# Patient Record
Sex: Female | Born: 1935 | Race: Black or African American | Hispanic: No | Smoking: Never smoker
Health system: Southern US, Community
[De-identification: ages and names within clinical notes are randomized; demographics above are authoritative.]

## PROBLEM LIST (undated history)

## (undated) DIAGNOSIS — I10 Essential (primary) hypertension: Secondary | ICD-10-CM

## (undated) DIAGNOSIS — R519 Headache, unspecified: Secondary | ICD-10-CM

## (undated) DIAGNOSIS — R51 Headache: Secondary | ICD-10-CM

## (undated) HISTORY — PX: APPENDECTOMY: SHX54

## (undated) HISTORY — PX: TUBAL LIGATION: SHX77

---

## 1998-09-30 ENCOUNTER — Other Ambulatory Visit: Admission: RE | Admit: 1998-09-30 | Discharge: 1998-09-30 | Payer: Self-pay | Admitting: Internal Medicine

## 1998-10-31 ENCOUNTER — Encounter: Admission: RE | Admit: 1998-10-31 | Discharge: 1998-12-16 | Payer: Self-pay | Admitting: Internal Medicine

## 2000-02-17 ENCOUNTER — Other Ambulatory Visit: Admission: RE | Admit: 2000-02-17 | Discharge: 2000-02-17 | Payer: Self-pay | Admitting: *Deleted

## 2000-02-27 ENCOUNTER — Encounter: Admission: RE | Admit: 2000-02-27 | Discharge: 2000-02-27 | Payer: Self-pay | Admitting: Internal Medicine

## 2000-02-27 ENCOUNTER — Encounter: Payer: Self-pay | Admitting: Internal Medicine

## 2001-04-29 ENCOUNTER — Encounter: Admission: RE | Admit: 2001-04-29 | Discharge: 2001-07-28 | Payer: Self-pay | Admitting: Internal Medicine

## 2001-05-05 ENCOUNTER — Encounter: Payer: Self-pay | Admitting: Family Medicine

## 2001-05-05 ENCOUNTER — Encounter: Admission: RE | Admit: 2001-05-05 | Discharge: 2001-05-05 | Payer: Self-pay | Admitting: Family Medicine

## 2004-12-01 ENCOUNTER — Other Ambulatory Visit: Admission: RE | Admit: 2004-12-01 | Discharge: 2004-12-01 | Payer: Self-pay | Admitting: Internal Medicine

## 2010-05-31 ENCOUNTER — Emergency Department (HOSPITAL_COMMUNITY)
Admission: EM | Admit: 2010-05-31 | Discharge: 2010-05-31 | Payer: Self-pay | Source: Home / Self Care | Admitting: Emergency Medicine

## 2011-08-14 ENCOUNTER — Encounter (HOSPITAL_COMMUNITY): Payer: Self-pay

## 2011-08-14 ENCOUNTER — Emergency Department (INDEPENDENT_AMBULATORY_CARE_PROVIDER_SITE_OTHER)
Admission: EM | Admit: 2011-08-14 | Discharge: 2011-08-14 | Disposition: A | Discharge status: Home / Self Care | Payer: Medicare Other | Source: Home / Self Care | Attending: Family Medicine | Admitting: Family Medicine

## 2011-08-14 DIAGNOSIS — H8309 Labyrinthitis, unspecified ear: Secondary | ICD-10-CM

## 2011-08-14 HISTORY — DX: Essential (primary) hypertension: I10

## 2011-08-14 MED ORDER — MECLIZINE HCL 25 MG PO TABS: 25.0000 mg | ORAL_TABLET | Freq: Four times a day (QID) | ORAL | Status: AC

## 2011-08-14 NOTE — ED Provider Notes (Signed)
History     CSN: [id number]  Arrival date & time 08/14/11  1619   First MD Initiated Contact with Patient 08/14/11 1715      Chief Complaint  Patient presents with  . Dizziness    (Consider location/radiation/quality/duration/timing/severity/associated sxs/prior treatment) HPI Comments: THE PATIENT REPORTS SHE WENT TO BED LAST PM FEELING FINE. WHEN SHE ROLLED OVER IN BED THIS AM THE BED BEGAN TO SPIN. SHE LAID STILL AND IT STOPPED UNTIL SHE MOVED AGAIN. SHE HAS ASSOCIATED NAUSEA. SYMPTOMS WORSE WITH CHANGE OF POSITION. HX OF SIMILAR EPISODE YRS AGO AND TOLD IT WAS INNER EAR.   The history is provided by the patient.    Past Medical History  Diagnosis Date  . Hypertension     History reviewed. No pertinent past surgical history.  History reviewed. No pertinent family history.  History  Substance Use Topics  . Smoking status: Not on file  . Smokeless tobacco: Not on file  . Alcohol Use:     OB History    Grav Para Term Preterm Abortions TAB SAB Ect Mult Living                  Review of Systems  Constitutional: Negative.   HENT: Negative.   Eyes: Negative.   Respiratory: Negative.   Cardiovascular: Negative.   Gastrointestinal: Positive for nausea. Negative for vomiting and diarrhea.  Genitourinary: Negative.   Skin: Negative.   Neurological: Positive for dizziness. Negative for headaches.    Allergies  Review of patient's allergies indicates no known allergies.  Home Medications   Current Outpatient Rx  Name Route Sig Dispense Refill  . ROSUVASTATIN CALCIUM 20 MG PO TABS Oral Take 20 mg by mouth daily.    . TELMISARTAN 80 MG PO TABS Oral Take 80 mg by mouth daily.    Marland Kitchen MECLIZINE HCL 25 MG PO TABS Oral Take 1 tablet (25 mg total) by mouth 4 (four) times daily. 28 tablet 0    BP 152/73  Pulse 76  Temp(Src) 98 F (36.7 C) (Oral)  Resp 18  SpO2 100%  Physical Exam  Constitutional: She is oriented to person, place, and time. She appears  well-developed and well-nourished.  HENT:  Head: Atraumatic.  Nose: Nose normal.  Mouth/Throat: No oropharyngeal exudate.  Eyes: Conjunctivae and EOM are normal. Pupils are equal, round, and reactive to light.  Neck: Normal range of motion. Neck supple.  Cardiovascular: Normal rate and regular rhythm.   Pulmonary/Chest: Effort normal.  Abdominal: Soft. Bowel sounds are normal.  Lymphadenopathy:    She has no cervical adenopathy.  Neurological: She is alert and oriented to person, place, and time. No cranial nerve deficit.    ED Course  Procedures (including critical care time)  Labs Reviewed - No data to display No results found.   1. Labyrinthitis       MDM          Randa Spike, MD 08/14/11 [phone]

## 2011-08-14 NOTE — ED Notes (Signed)
State she has been dizzy and nauseated since she got up this AM; went to Dr Avbreue's office this AM, but they couldn't see her since thy were busy. NAD at present; states her dizziness is worse when she changes position too quickly.  States she has not eaten today, because she is afraid to do it, denies pain

## 2011-08-14 NOTE — Discharge Instructions (Signed)
TYLENOL OR MOTRIN AS NEEDED. KEEP YOUR HEAD LEVEL.Labyrinthitis (Inner Ear Inflammation) Your exam shows you have an inner ear disturbance or labyrinthitis. The cause of this condition is not known. But it may be due to a virus infection. The symptoms of labyrinthitis include vertigo or dizziness made worse by motion, nausea and vomiting. The onset of labyrinthitis may be very sudden. It usually lasts for a few days and then clears up over 1-2 weeks. The treatment of an inner ear disturbance includes bed rest and medications to reduce dizziness, nausea, and vomiting. You should stay away from alcohol, tranquilizers, caffeine, nicotine, or any medicine your doctor thinks may make your symptoms worse. Further testing may be needed to evaluate your hearing and balance system. Please see your doctor or go to the emergency room right away if you have:  Increasing vertigo, earache, loss of hearing, or ear drainage.   Headache, blurred vision, trouble walking, fainting, or fever.   Persistent vomiting, dehydration, or extreme weakness.  Document Released: 05/04/2005 Document Revised: 04/23/2011 Document Reviewed: 10/20/2006 [facility] Patient Information 2012 [place], [place].

## 2014-01-31 ENCOUNTER — Other Ambulatory Visit: Payer: Self-pay | Admitting: Internal Medicine

## 2014-01-31 DIAGNOSIS — N63 Unspecified lump in unspecified breast: Secondary | ICD-10-CM

## 2017-02-14 ENCOUNTER — Emergency Department (HOSPITAL_COMMUNITY)
Admission: EM | Admit: 2017-02-14 | Discharge: 2017-02-14 | Disposition: A | Discharge status: Home / Self Care | Payer: Medicare Other | Attending: Emergency Medicine | Admitting: Emergency Medicine

## 2017-02-14 ENCOUNTER — Emergency Department (HOSPITAL_COMMUNITY): Payer: Medicare Other

## 2017-02-14 ENCOUNTER — Encounter (HOSPITAL_COMMUNITY): Payer: Self-pay | Admitting: *Deleted

## 2017-02-14 DIAGNOSIS — M4302 Spondylolysis, cervical region: Secondary | ICD-10-CM

## 2017-02-14 DIAGNOSIS — I10 Essential (primary) hypertension: Secondary | ICD-10-CM | POA: Diagnosis not present

## 2017-02-14 DIAGNOSIS — M542 Cervicalgia: Secondary | ICD-10-CM | POA: Diagnosis not present

## 2017-02-14 DIAGNOSIS — R51 Headache: Secondary | ICD-10-CM | POA: Diagnosis present

## 2017-02-14 HISTORY — DX: Headache, unspecified: R51.9

## 2017-02-14 HISTORY — DX: Headache: R51

## 2017-02-14 MED ORDER — ACETAMINOPHEN 500 MG PO TABS
1000.0000 mg | ORAL_TABLET | Freq: Once | ORAL | Status: AC
  Administered 2017-02-14: 1000 mg via ORAL
  Filled 2017-02-14: qty 2

## 2017-02-14 MED ORDER — DICLOFENAC SODIUM 1 % TD GEL: 2.0000 g | Freq: Four times a day (QID) | TRANSDERMAL | 0 refills | Status: AC

## 2017-02-14 MED ORDER — METHOCARBAMOL 1000 MG/10ML IJ SOLN
500.0000 mg | Freq: Once | INTRAMUSCULAR | Status: DC
  Filled 2017-02-14: qty 5

## 2017-02-14 MED ORDER — METHOCARBAMOL 1000 MG/10ML IJ SOLN: 500.0000 mg | Freq: Once | INTRAMUSCULAR | Status: DC

## 2017-02-14 MED ORDER — CARISOPRODOL 350 MG PO TABS: 350.0000 mg | ORAL_TABLET | Freq: Four times a day (QID) | ORAL | 0 refills | Status: AC | PRN

## 2017-02-14 MED ORDER — METHOCARBAMOL 500 MG PO TABS
500.0000 mg | ORAL_TABLET | ORAL | Status: AC
  Administered 2017-02-14: 500 mg via ORAL
  Filled 2017-02-14: qty 1

## 2017-02-14 MED ORDER — ACETAMINOPHEN 500 MG PO TABS: 1000.0000 mg | ORAL_TABLET | Freq: Three times a day (TID) | ORAL | 0 refills | Status: AC | PRN

## 2017-02-14 NOTE — ED Notes (Signed)
Bed: [facility] Expected date: 02/14/17 Expected time: 1:48 PM Means of arrival: Ambulance Comments: Headache, chronic  2 weeks

## 2017-02-14 NOTE — ED Notes (Signed)
Patient transported to CT 

## 2017-02-14 NOTE — Discharge Instructions (Signed)
Your CT scan showed spondylolysis, which is a forward displacement of vertebra usually due to trauma, repetitive motion or aging.  This is likely what is causing your pain.   Please take 1000 mg tylenol every 8 hours. Additionally, massage voltaren gel over area of pain. Robaxin will help with associated muscle spasms and tightness. Place a heating pad over the area.   Follow up with neurosurgery for further management of your pain.   Return to ED for worsening, uncontrollable pain, numbness or weakness to extremities, fevers, chills

## 2017-02-14 NOTE — ED Provider Notes (Signed)
[facility]-EMERGENCY DEPT Provider Note   CSN: [id number] Arrival date & time: 02/14/17  1354     History   Chief Complaint Chief Complaint  Patient presents with  . Headache    HPI Marilyn Davidson is a 83 y.o. female with a history of well-controlled hypertension presents to the ED for evaluation of right-sided neck pain, constant, nonradiating 2 weeks file worsening over the last day. Has been taking extra strength Excedrin twice a day without relief. Denies recent trauma, MVC, changes in sleeping positions. Daughter at bedside states patient often dozes off at the kitchen table with her head bobbing up and down. They just got her a neck pillow which patient did not like and has not used. Patient denies headache, visual changes, fevers, nausea, vomiting, chest pain, shortness of breath, numbness or weakness to extremities.  HPI  Past Medical History:  Diagnosis Date  . Headache   . Hypertension     There are no active problems to display for this patient.   Past Surgical History:  Procedure Laterality Date  . APPENDECTOMY    . TUBAL LIGATION      OB History    No data available       Home Medications    Prior to Admission medications   Medication Sig Start Date End Date Taking? Authorizing Provider  acetaminophen (TYLENOL) 500 MG tablet Take 2 tablets (1,000 mg total) by mouth every 8 (eight) hours as needed. 02/14/17   Liberty Handy, PA-C  carisoprodol (SOMA) 350 MG tablet Take 1 tablet (350 mg total) by mouth 4 (four) times daily as needed for muscle spasms. 02/14/17   Liberty Handy, PA-C  diclofenac sodium (VOLTAREN) 1 % GEL Apply 2 g topically 4 (four) times daily. 02/14/17   Liberty Handy, PA-C  rosuvastatin (CRESTOR) 20 MG tablet Take 20 mg by mouth daily.    [provider]  telmisartan (MICARDIS) 80 MG tablet Take 80 mg by mouth daily.    [provider]    Family History No family history on file.  Social History Social  History  Substance Use Topics  . Smoking status: Never Smoker  . Smokeless tobacco: Never Used  . Alcohol use No     Allergies   Patient has no known allergies.   Review of Systems Review of Systems  Constitutional: Negative for chills and fever.  HENT: Negative for sore throat.   Eyes: Negative for photophobia and visual disturbance.  Respiratory: Negative for shortness of breath.   Cardiovascular: Negative for chest pain.  Gastrointestinal: Negative for diarrhea, nausea and vomiting.  Genitourinary: Negative for dysuria.  Musculoskeletal: Positive for myalgias, neck pain and neck stiffness.  Skin: Negative for color change and wound.  Allergic/Immunologic: Negative for immunocompromised state.  Neurological: Negative for weakness, light-headedness, numbness and headaches.     Physical Exam Updated Vital Signs BP (!) 151/74 (BP Location: Left Arm)   Pulse 72   Temp 98.1 F (36.7 C) (Oral)   Resp 18   SpO2 98%   Physical Exam  Constitutional: She is oriented to person, place, and time. She appears well-developed and well-nourished. No distress.  NAD.  HENT:  Head: Normocephalic and atraumatic.  Right Ear: External ear normal.  Left Ear: External ear normal.  Nose: Nose normal.  No mastoid tenderness bilaterally External ears normal w/o pain with manipulation Moderate cerumen build up in bilateral canals, partially able to visualize TM and normal  Eyes: Conjunctivae and EOM are normal. No  scleral icterus.  Neck: Normal range of motion. Neck supple. Muscular tenderness present.    Tenderness along lateral/anterior right neck from below mastoid to proximal trapezius Decreased PROM of neck secondary to pain No midline cervical spine tenderness or step offs  Cardiovascular: Normal rate, regular rhythm, S1 normal, S2 normal and normal heart sounds.  Exam reveals no friction rub.   No murmur heard. Pulses:      Carotid pulses are 2+ on the right side, and 2+ on the  left side.      Radial pulses are 2+ on the right side, and 2+ on the left side.       Dorsalis pedis pulses are 2+ on the right side, and 2+ on the left side.       Posterior tibial pulses are 2+ on the right side, and 2+ on the left side.  No carotid bruits bilaterally  Pulmonary/Chest: Effort normal and breath sounds normal. She has no wheezes.  Abdominal: Soft. There is no tenderness.  Musculoskeletal: Normal range of motion. She exhibits no deformity.  Lymphadenopathy:  No head or cervical adenopathy   Neurological: She is alert and oriented to person, place, and time.  Skin: Skin is warm and dry. Capillary refill takes less than 2 seconds.  No rash or skin changes to anterior/posterior neck  Psychiatric: She has a normal mood and affect. Her behavior is normal. Judgment and thought content normal.  Nursing note and vitals reviewed.    ED Treatments / Results  Labs (all labs ordered are listed, but only abnormal results are displayed) Labs Reviewed - No data to display  EKG  EKG Interpretation None       Radiology Ct Soft Tissue Neck Wo Contrast  Result Date: 02/14/2017 CLINICAL DATA:  Initial evaluation for acute neck pain. EXAM: CT NECK WITHOUT CONTRAST TECHNIQUE: Multidetector CT imaging of the neck was performed following the standard protocol without intravenous contrast. COMPARISON:  None. FINDINGS: Pharynx and larynx: Oral cavity within normal limits without mass lesion or loculated fluid collection. Scattered dental caries and periapical lucencies present about the remaining dentition. No acute inflammatory changes seen about the teeth. Oropharynx within normal limits. Parapharyngeal fat preserved. Nasopharynx normal. Retropharyngeal soft tissues normal. Epiglottis within normal limits. Vallecula partially effaced by the lingual tonsils. Remainder of the hypopharynx and supraglottic larynx within normal limits. True cords symmetric and normal. Subglottic airway somewhat  tortuous but widely patent. Salivary glands: Salivary glands including the parotid and submandibular glands within normal limits. Punctate calcification at the right floor of mouth favored to be vascular in nature. Thyroid: Thyroid within normal limits. Lymph nodes: No adenopathy identified within the neck. Vascular: Scattered atheromatous plaque about the aortic arch in carotid bifurcations. No acute vascular abnormality appreciated on this noncontrast examination. Limited intracranial: Age related cerebral volume loss with intracranial atherosclerosis. Otherwise unremarkable. Visualized orbits: Visualized globes and orbital soft tissues within normal limits. Patient status post lens extraction bilaterally. Mastoids and visualized paranasal sinuses: Chronic mucosal thickening within the maxillary sinuses bilaterally. Visualized paranasal sinuses otherwise clear. Mastoid air cells and middle air cavities are clear and well pneumatized. Skeleton: No acute osseous abnormality. No worrisome lytic or blastic osseous lesions. Moderate degenerative spondylolysis present at C3-4 through C6-7. No significant spinal stenosis. Upper chest: Visualized upper chest within normal limits. Partially visualized lungs are clear. Other: None. IMPRESSION: 1. No CT evidence for acute abnormality identified within the neck. 2. Moderate degenerative spondylolysis at C3-4 through C6-7. 3. Scattered dental caries about the  remaining dentition. No associated acute inflammatory changes. 4. Atherosclerotic changes about the aortic arch and carotid bifurcations bilaterally Electronically Signed   By: Rise Mu M.D.   On: 02/14/2017 15:59    Procedures Procedures (including critical care time)  Medications Ordered in ED Medications  acetaminophen (TYLENOL) tablet 1,000 mg (1,000 mg Oral Given 02/14/17 1512)  methocarbamol (ROBAXIN) tablet 500 mg (500 mg Oral Given 02/14/17 1559)     Initial Impression / Assessment and Plan  / ED Course  I have reviewed the triage vital signs and the nursing notes.  Pertinent labs & imaging results that were available during my care of the patient were reviewed by me and considered in my medical decision making (see chart for details).  Clinical Course as of Feb 15 1631  Wynelle Link Feb 14, 2017  1604 IMPRESSION: 1. No CT evidence for acute abnormality identified within the neck. 2. Moderate degenerative spondylolysis at C3-4 through C6-7. 3. Scattered dental caries about the remaining dentition. No associated acute inflammatory changes. 4. Atherosclerotic changes about the aortic arch and carotid bifurcations bilaterally CT Soft Tissue Neck Wo Contrast [CG]    Clinical Course User Index [CG] Liberty Handy, PA-C   83 year old female with history of well-controlled hypertension presents to ED for gradually worsening right sided neck pain 2 weeks. Was seen by PCP a few days ago and had x-rays of the neck done, was told likely from arthritis however not improving with Tylenol BID at home. No recent trauma, falls however patient's daughter states that pt often falls asleep/nods off at the kitchen table and this may have caused her neck pain. No headache, fevers, chills, dysphagia, CP, SOB.  On exam, patient is well-appearing. Vital signs are within normal limits. No overlying skin changes or rashes over neck. She is exquisitely tender to light palpation over the right neck/anterior neck however I can't palpate any lymph nodes, lymphadenopathy, masses or other obvious abnormalities on physical exam on her neck. She has limited PROM of neck secondary to pain. No midline cervical spine tenderness. No carotid bruits. No neuro focal deficits to cranial nerves or upper extremities. She does have mild tenderness to proximal trapezius. Initially suspect musculoskeletal etiology. Given age, atraumatic pain, and refractory symptoms to conservative management as outpatient will get CT soft tissue of  neck. We'll give Robaxin, Tylenol and heating pad and follow-up on imaging.  Final Clinical Impressions(s) / ED Diagnoses   CT soft tissue neck negative for acute abnormality in the neck other than moderate degenerative spondylolysis at C3 through C6-7. Pt was discussed with supervising physician who agrees with ED management. Will discharge with conservative management and neurosurgery f/u. Pt lives with daughter and has 24/7 care at home, has established care with PCP who she can f/u as outpatient.   Final diagnoses:  Neck pain  Spondylolysis of cervical region    New Prescriptions New Prescriptions   ACETAMINOPHEN (TYLENOL) 500 MG TABLET    Take 2 tablets (1,000 mg total) by mouth every 8 (eight) hours as needed.   CARISOPRODOL (SOMA) 350 MG TABLET    Take 1 tablet (350 mg total) by mouth 4 (four) times daily as needed for muscle spasms.   DICLOFENAC SODIUM (VOLTAREN) 1 % GEL    Apply 2 g topically 4 (four) times daily.     Liberty Handy, PA-C 02/14/17 1632    Lorre Nick, MD 02/15/17 [phone]

## 2017-02-14 NOTE — ED Notes (Signed)
Pt's family verbalized understanding of discharge instructions.

## 2017-02-14 NOTE — ED Triage Notes (Signed)
EMS reports pts family called due to pt having a headache for 2 weeks, has been seen and told the pain is related to arthritis in her neck, encouraged to take extra strength Tylenol for pain. Today pt feels she is not getting any relief.

## 2017-03-18 DEATH — deceased

## 2018-10-06 IMAGING — CT CT NECK W/O CM
3 of 4 series · 12 of 33 positions shown, 14 images · non-contrast
Comparison: None.

CLINICAL DATA: Initial evaluation for acute neck pain.

EXAM:
CT NECK WITHOUT CONTRAST
TECHNIQUE: Multidetector CT imaging of the neck was performed following the
standard protocol without intravenous contrast.

[Series 3: neckstw/cm · axial · 0.35mm/px · z∈[+512,+664]mm · 4 of 114 slices shown, 5 images]
[im 19/114  soft-tissue]
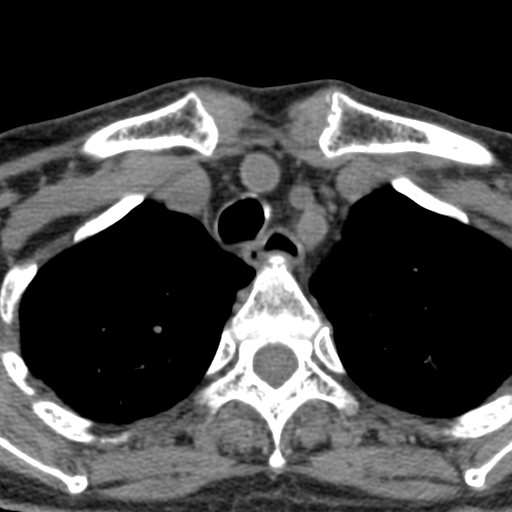
[im 19/114  bone]
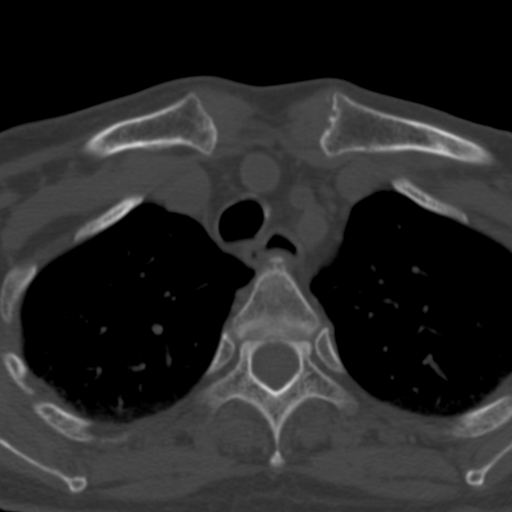
[im 38/114  bone]
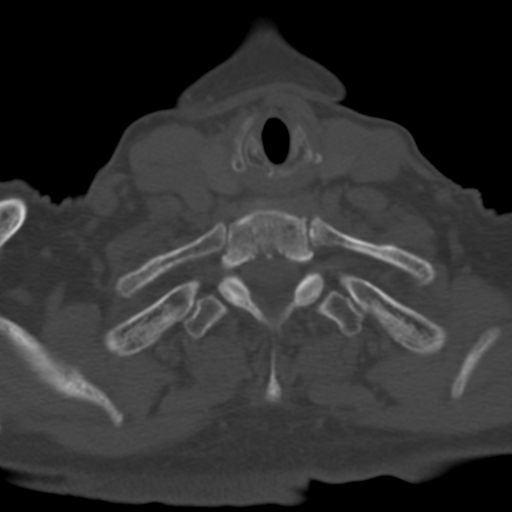
[im 76/114  bone]
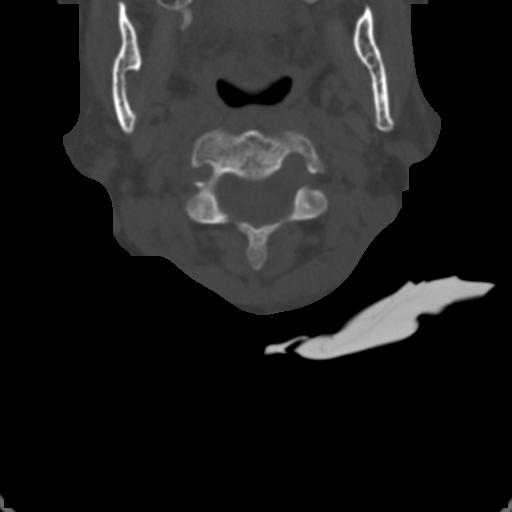
[im 95/114  bone]
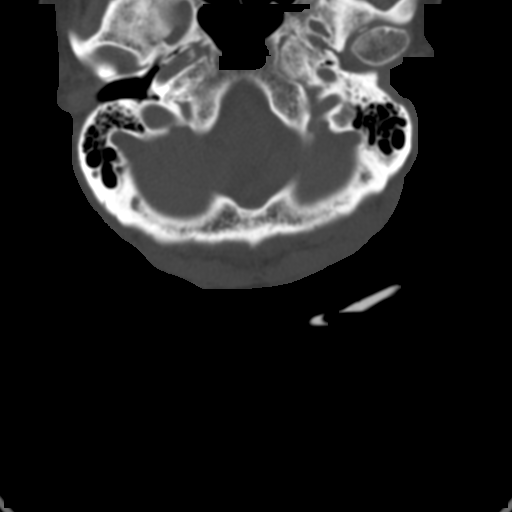

[Series 4: coronal st · coronal · 0.34mm/px · 3 of 100 slices shown]
[im 26/100  bone]
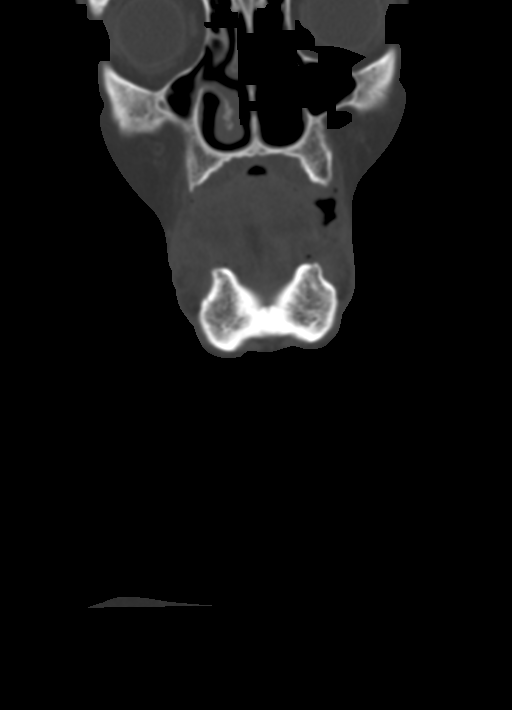
[im 42/100  bone]
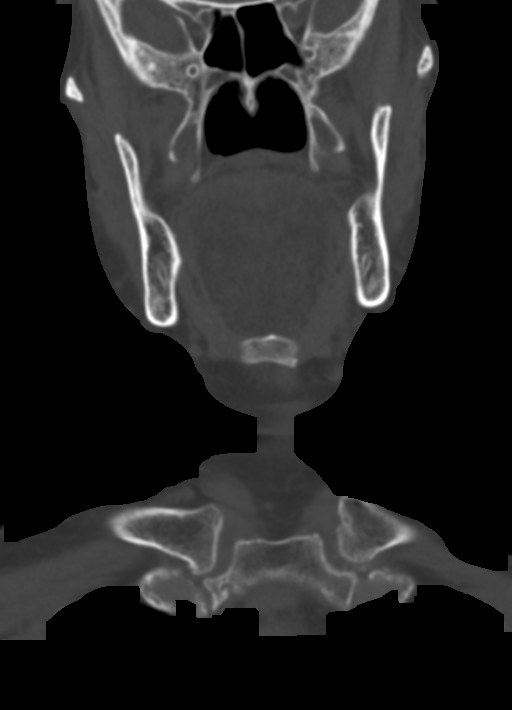
[im 58/100  bone]
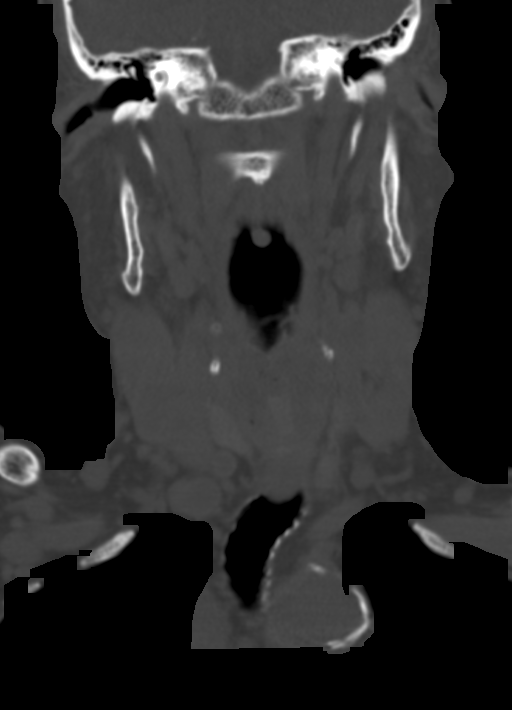

[Series 5: sagittal st · sagittal · 0.41mm/px · 5 of 81 slices shown, 6 images]
[im 27/81  bone]
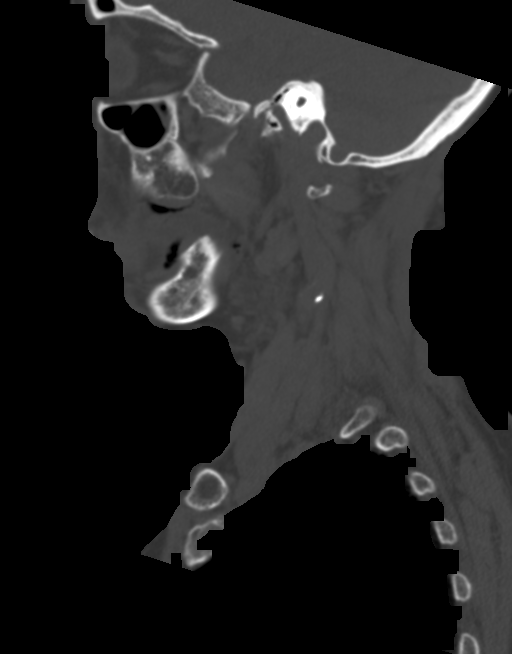
[im 34/81  bone]
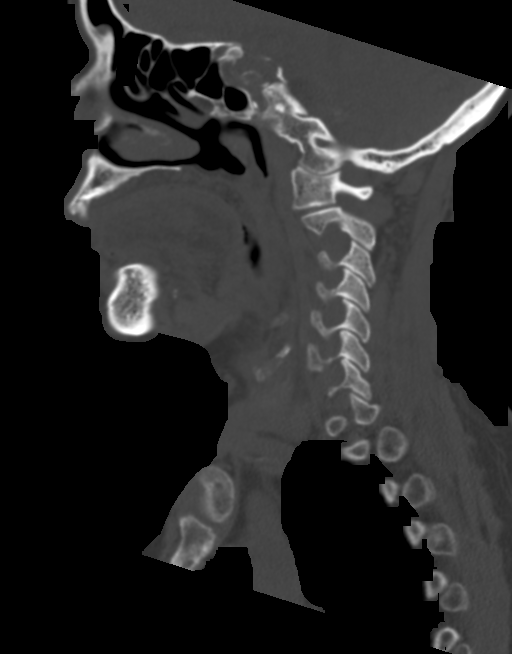
[im 41/81  soft-tissue]
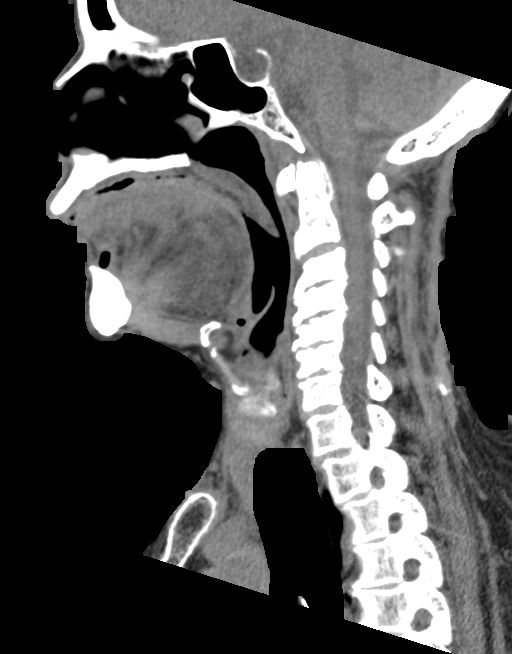
[im 41/81  bone]
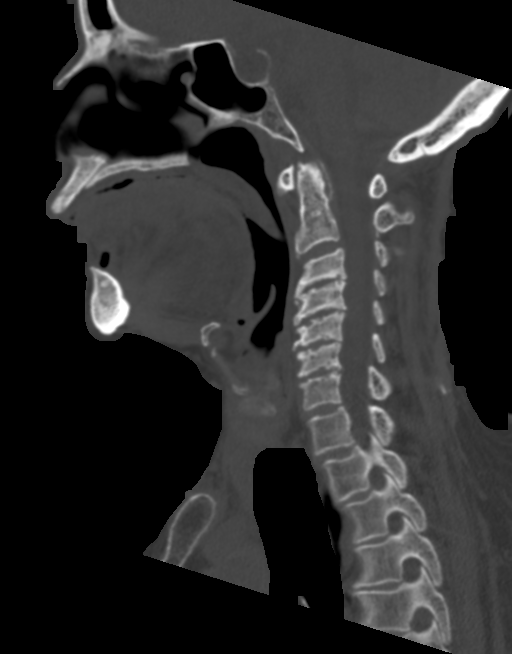
[im 47/81  bone]
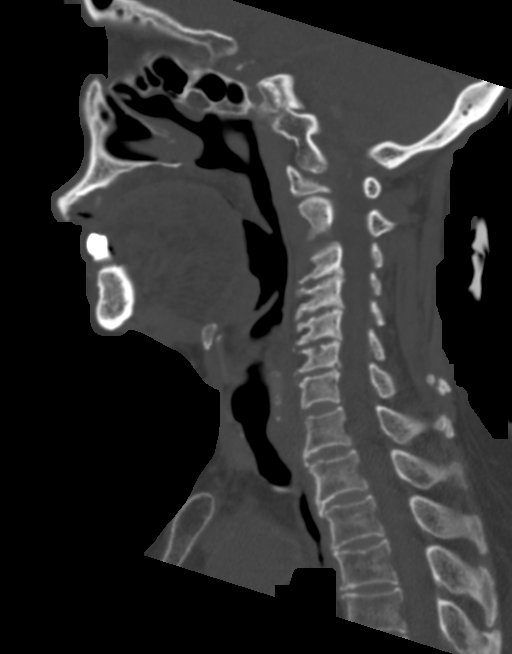
[im 54/81  bone]
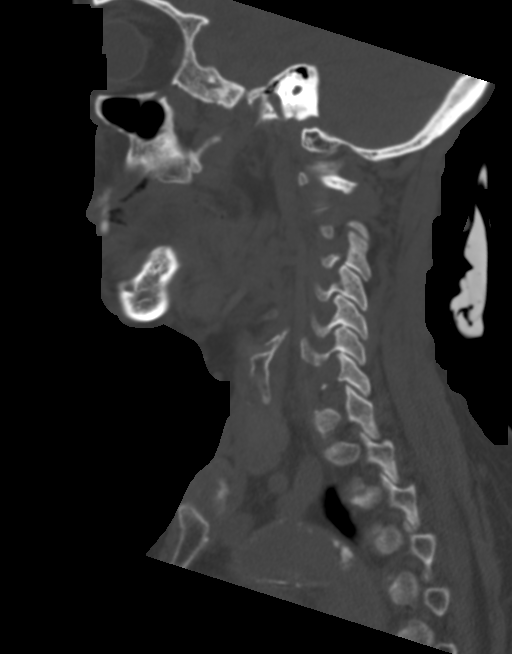

[12 of 33 positions shown; findings below may reference images not displayed]

FINDINGS: Pharynx and larynx: Oral cavity within normal limits without mass
lesion or loculated fluid collection. Scattered dental caries and
periapical lucencies present about the remaining dentition. No acute
inflammatory changes seen about the teeth. Oropharynx within normal
limits. Parapharyngeal fat preserved. Nasopharynx normal.
Retropharyngeal soft tissues normal. Epiglottis within normal
limits. Vallecula partially effaced by the lingual tonsils.
Remainder of the hypopharynx and supraglottic larynx within normal
limits. True cords symmetric and normal. Subglottic airway somewhat
tortuous but widely patent.

Salivary glands: Salivary glands including the parotid and
submandibular glands within normal limits. Punctate calcification at
the right floor of mouth favored to be vascular in nature.

Thyroid: Thyroid within normal limits.

Lymph nodes: No adenopathy identified within the neck.

Vascular: Scattered atheromatous plaque about the aortic arch in
carotid bifurcations. No acute vascular abnormality appreciated on
this noncontrast examination.

Limited intracranial: Age related cerebral volume loss with
intracranial atherosclerosis. Otherwise unremarkable.

Visualized orbits: Visualized globes and orbital soft tissues within
normal limits. Patient status post lens extraction bilaterally.

Mastoids and visualized paranasal sinuses: Chronic mucosal
thickening within the maxillary sinuses bilaterally. Visualized
paranasal sinuses otherwise clear. Mastoid air cells and middle air
cavities are clear and well pneumatized.

Skeleton: No acute osseous abnormality. No worrisome lytic or
blastic osseous lesions. Moderate degenerative spondylolysis present
at C3-4 through C6-7. No significant spinal stenosis.

Upper chest: Visualized upper chest within normal limits. Partially
visualized lungs are clear.

Other: None.
IMPRESSION: 1. No CT evidence for acute abnormality identified within the neck.
2. Moderate degenerative spondylolysis at C3-4 through C6-7.
3. Scattered dental caries about the remaining dentition. No
associated acute inflammatory changes.
4. Atherosclerotic changes about the aortic arch and carotid
bifurcations bilaterally
